# Patient Record
Sex: Female | Born: 1995 | Race: Black or African American | Hispanic: No | Marital: Single | Smoking: Current some day smoker
Health system: Southern US, Community
[De-identification: ages and names within clinical notes are randomized; demographics above are authoritative.]

## PROBLEM LIST (undated history)

## (undated) DIAGNOSIS — D649 Anemia, unspecified: Secondary | ICD-10-CM

---

## 2021-01-14 ENCOUNTER — Other Ambulatory Visit: Payer: Self-pay

## 2021-01-14 ENCOUNTER — Emergency Department (HOSPITAL_COMMUNITY): Payer: BC Managed Care – PPO

## 2021-01-14 ENCOUNTER — Observation Stay (HOSPITAL_COMMUNITY)
Admission: EM | Admit: 2021-01-14 | Discharge: 2021-01-15 | Disposition: A | Discharge status: Home / Self Care | Payer: BC Managed Care – PPO | Attending: Internal Medicine | Admitting: Internal Medicine

## 2021-01-14 ENCOUNTER — Encounter (HOSPITAL_COMMUNITY): Payer: Self-pay

## 2021-01-14 DIAGNOSIS — R509 Fever, unspecified: Secondary | ICD-10-CM

## 2021-01-14 DIAGNOSIS — Z20822 Contact with and (suspected) exposure to covid-19: Secondary | ICD-10-CM | POA: Diagnosis not present

## 2021-01-14 DIAGNOSIS — Z79899 Other long term (current) drug therapy: Secondary | ICD-10-CM | POA: Diagnosis not present

## 2021-01-14 DIAGNOSIS — R1030 Lower abdominal pain, unspecified: Secondary | ICD-10-CM | POA: Insufficient documentation

## 2021-01-14 DIAGNOSIS — N390 Urinary tract infection, site not specified: Principal | ICD-10-CM | POA: Insufficient documentation

## 2021-01-14 DIAGNOSIS — R000 Tachycardia, unspecified: Secondary | ICD-10-CM | POA: Diagnosis not present

## 2021-01-14 DIAGNOSIS — F17290 Nicotine dependence, other tobacco product, uncomplicated: Secondary | ICD-10-CM | POA: Diagnosis not present

## 2021-01-14 DIAGNOSIS — D649 Anemia, unspecified: Secondary | ICD-10-CM | POA: Insufficient documentation

## 2021-01-14 DIAGNOSIS — R300 Dysuria: Secondary | ICD-10-CM

## 2021-01-14 HISTORY — DX: Anemia, unspecified: D64.9

## 2021-01-14 LAB — COMPREHENSIVE METABOLIC PANEL
ALT: 15 U/L (ref 0–44)
AST: 18 U/L (ref 15–41)
Albumin: 3.3 g/dL — ABNORMAL LOW (ref 3.5–5.0)
Alkaline Phosphatase: 42 U/L (ref 38–126)
Anion gap: 6 (ref 5–15)
BUN: <5 mg/dL — ABNORMAL LOW (ref 6–20)
CO2: 24 mmol/L (ref 22–32)
Calcium: 8.8 mg/dL — ABNORMAL LOW (ref 8.9–10.3)
Chloride: 108 mmol/L (ref 98–111)
Creatinine, Ser: 0.67 mg/dL (ref 0.44–1.00)
GFR, Estimated: >60 mL/min (ref >60)
Glucose, Bld: 120 mg/dL — ABNORMAL HIGH (ref 70–99)
Potassium: 3.3 mmol/L — ABNORMAL LOW (ref 3.5–5.1)
Sodium: 138 mmol/L (ref 135–145)
Total Bilirubin: 0.5 mg/dL (ref 0.3–1.2)
Total Protein: 7.4 g/dL (ref 6.5–8.1)

## 2021-01-14 LAB — CBC WITH DIFFERENTIAL/PLATELET
Abs Immature Granulocytes: 0.1 10*3/uL — ABNORMAL HIGH (ref 0.00–0.07)
Basophils Absolute: 0 10*3/uL (ref 0.0–0.1)
Basophils Relative: 0 %
Eosinophils Absolute: 0.5 10*3/uL (ref 0.0–0.5)
Eosinophils Relative: 3 %
HCT: 28.8 % — ABNORMAL LOW (ref 36.0–46.0)
Hemoglobin: 8.2 g/dL — ABNORMAL LOW (ref 12.0–15.0)
Immature Granulocytes: 1 %
Lymphocytes Relative: 5 %
Lymphs Abs: 0.9 10*3/uL (ref 0.7–4.0)
MCH: 22 pg — ABNORMAL LOW (ref 26.0–34.0)
MCHC: 28.5 g/dL — ABNORMAL LOW (ref 30.0–36.0)
MCV: 77.2 fL — ABNORMAL LOW (ref 80.0–100.0)
Monocytes Absolute: 1.1 10*3/uL — ABNORMAL HIGH (ref 0.1–1.0)
Monocytes Relative: 6 %
Neutro Abs: 15.3 10*3/uL — ABNORMAL HIGH (ref 1.7–7.7)
Neutrophils Relative %: 85 %
Platelets: 503 10*3/uL — ABNORMAL HIGH (ref 150–400)
RBC: 3.73 MIL/uL — ABNORMAL LOW (ref 3.87–5.11)
RDW: 16.6 % — ABNORMAL HIGH (ref 11.5–15.5)
WBC: 18 10*3/uL — ABNORMAL HIGH (ref 4.0–10.5)
nRBC: 0 % (ref 0.0–0.2)

## 2021-01-14 LAB — LACTIC ACID, PLASMA
Lactic Acid, Venous: 2 mmol/L (ref 0.5–1.9)
Lactic Acid, Venous: 1 mmol/L (ref 0.5–1.9)

## 2021-01-14 LAB — URINALYSIS, ROUTINE W REFLEX MICROSCOPIC
Bilirubin Urine: NEGATIVE
Glucose, UA: NEGATIVE mg/dL
Hgb urine dipstick: NEGATIVE
Ketones, ur: NEGATIVE mg/dL
Leukocytes,Ua: NEGATIVE
Nitrite: NEGATIVE
Protein, ur: NEGATIVE mg/dL
Specific Gravity, Urine: 1.009 (ref 1.005–1.030)
pH: 8 (ref 5.0–8.0)

## 2021-01-14 LAB — TROPONIN I (HIGH SENSITIVITY)
Troponin I (High Sensitivity): 3 ng/L (ref <18)
Troponin I (High Sensitivity): 12 ng/L (ref <18)

## 2021-01-14 LAB — PROTIME-INR
INR: 1 (ref 0.8–1.2)
Prothrombin Time: 13.6 seconds (ref 11.4–15.2)

## 2021-01-14 LAB — LIPASE, BLOOD: Lipase: 22 U/L (ref 11–51)

## 2021-01-14 LAB — HIV ANTIBODY (ROUTINE TESTING W REFLEX): HIV Screen 4th Generation wRfx: NONREACTIVE

## 2021-01-14 LAB — I-STAT BETA HCG BLOOD, ED (MC, WL, AP ONLY): I-stat hCG, quantitative: <5 m[IU]/mL (ref <5)

## 2021-01-14 LAB — RESP PANEL BY RT-PCR (FLU A&B, COVID) ARPGX2
Influenza A by PCR: NEGATIVE
Influenza B by PCR: NEGATIVE
SARS Coronavirus 2 by RT PCR: NEGATIVE

## 2021-01-14 LAB — APTT: aPTT: 32 seconds (ref 24–36)

## 2021-01-14 MED ORDER — LACTATED RINGERS IV BOLUS
1000.0000 mL | Freq: Once | INTRAVENOUS | Status: AC
  Administered 2021-01-14: 1000 mL via INTRAVENOUS

## 2021-01-14 MED ORDER — PIPERACILLIN-TAZOBACTAM 3.375 G IVPB
3.3750 g | Freq: Three times a day (TID) | INTRAVENOUS | Status: DC
  Administered 2021-01-14 – 2021-01-15 (×2): 3.375 g via INTRAVENOUS
  Filled 2021-01-14 (×3): qty 50

## 2021-01-14 MED ORDER — ACETAMINOPHEN 325 MG PO TABS: 650.0000 mg | ORAL_TABLET | Freq: Four times a day (QID) | ORAL | Status: DC | PRN

## 2021-01-14 MED ORDER — ENOXAPARIN SODIUM 40 MG/0.4ML IJ SOSY
40.0000 mg | PREFILLED_SYRINGE | INTRAMUSCULAR | Status: DC
  Administered 2021-01-14: 40 mg via SUBCUTANEOUS
  Filled 2021-01-14: qty 0.4

## 2021-01-14 MED ORDER — LACTATED RINGERS IV BOLUS
850.0000 mL | Freq: Once | INTRAVENOUS | Status: AC
  Administered 2021-01-14: 850 mL via INTRAVENOUS

## 2021-01-14 MED ORDER — ACETAMINOPHEN 325 MG PO TABS
650.0000 mg | ORAL_TABLET | Freq: Once | ORAL | Status: AC
  Administered 2021-01-14: 650 mg via ORAL
  Filled 2021-01-14: qty 2

## 2021-01-14 MED ORDER — ACETAMINOPHEN 650 MG RE SUPP: 650.0000 mg | Freq: Four times a day (QID) | RECTAL | Status: DC | PRN

## 2021-01-14 MED ORDER — IOHEXOL 350 MG/ML SOLN
100.0000 mL | Freq: Once | INTRAVENOUS | Status: AC | PRN
  Administered 2021-01-14: 100 mL via INTRAVENOUS

## 2021-01-14 MED ORDER — PIPERACILLIN-TAZOBACTAM 3.375 G IVPB 30 MIN
3.3750 g | Freq: Once | INTRAVENOUS | Status: AC
  Administered 2021-01-14: 3.375 g via INTRAVENOUS
  Filled 2021-01-14: qty 50

## 2021-01-14 NOTE — ED Provider Notes (Signed)
[facility] EMERGENCY DEPARTMENT Provider Note   CSN: [id number] Arrival date & time: 01/14/21  4098     History Chief Complaint  Patient presents with   Tachycardia    Melody Holloway is a 25 y.o. female.  HPI Patient is a 25 year old female who presents to the emergency department due to dysuria.  Patient states that her symptoms started about 1 week ago.  She was initially experiencing some mild lower abdominal pain and went to urgent care for evaluation.  Her UA was pending at the time but they started her on Macrobid due to her symptoms.  She states that she took about 3 doses and was called by urgent care and told that her UA was negative so she discontinued this medication.  She states that after this she then went to the emergency department for evaluation and was told that she had a reassuring work-up and was discharged in stable condition.  About 4 to 5 days ago her symptoms began once again with associated dysuria.  She states that she has been taking Azo.  This morning she woke up with her dysuria as well as lower abdominal pain but also notes fatigue, body aches, rhinorrhea, cough, mild sore throat.  Reports a history of previous COVID-19 infection in early 2021 but has not been vaccinated for COVID-19.  Patient also notes some chest tightness this morning when her symptoms first started but this has resolved.  She has been having persistent shortness of breath since waking up this morning which is why she called EMS.  Upon EMS arrival patient's heart rate was about 160 bpm.  They performed vagal maneuvers which dropped her heart rate to 90 briefly before her tachycardia resumed once again.    Past Medical History:  Diagnosis Date   Anemia     There are no problems to display for this patient.   History reviewed. No pertinent surgical history.   OB History   No obstetric history on file.     History reviewed. No pertinent family history.  Social History    Tobacco Use   Smoking status: Some Days    Types: Pipe   Smokeless tobacco: Never  Vaping Use   Vaping Use: Never used  Substance Use Topics   Alcohol use: Not Currently   Drug use: Never    Home Medications Prior to Admission medications   Medication Sig Start Date End Date Taking? Authorizing Provider  ibuprofen (ADVIL) 200 MG tablet Take 200 mg by mouth every 6 (six) hours as needed for mild pain or headache.   Yes [provider]  loratadine (CLARITIN) 10 MG tablet Take 10 mg by mouth daily as needed for allergies.   Yes [provider]  nitrofurantoin, macrocrystal-monohydrate, (MACROBID) 100 MG capsule Take 100 mg by mouth 2 (two) times daily. 10 DS 01/05/21  Yes [provider]  phenazopyridine (PYRIDIUM) 95 MG tablet Take 95 mg by mouth 3 (three) times daily as needed for pain.   Yes [provider]    Allergies    Patient has no known allergies.  Review of Systems   Review of Systems  All other systems reviewed and are negative. Ten systems reviewed and are negative for acute change, except as noted in the HPI.   Physical Exam Updated Vital Signs BP 132/73   Pulse (!) 124   Temp 98.6 F (37 C) (Oral)   Resp (!) 25   Ht  (1.702 m)   Wt  95.7 kg   SpO2 100%   BMI 33.05 kg/m   Physical Exam Vitals and nursing note reviewed.  Constitutional:      General: She is not in acute distress.    Appearance: Normal appearance. She is not ill-appearing, toxic-appearing or diaphoretic.  HENT:     Head: Normocephalic and atraumatic.     Right Ear: External ear normal.     Left Ear: External ear normal.     Nose: Nose normal.     Mouth/Throat:     Mouth: Mucous membranes are moist.     Pharynx: Oropharynx is clear. No oropharyngeal exudate or posterior oropharyngeal erythema.  Eyes:     Extraocular Movements: Extraocular movements intact.  Cardiovascular:     Rate and Rhythm: Normal rate and regular rhythm.     Pulses: Normal  pulses.     Heart sounds: Normal heart sounds. No murmur heard.   No friction rub. No gallop.  Pulmonary:     Effort: Pulmonary effort is normal. No respiratory distress.     Breath sounds: Normal breath sounds. No stridor. No wheezing, rhonchi or rales.  Abdominal:     General: Abdomen is flat.     Tenderness: There is no abdominal tenderness.  Musculoskeletal:        General: Normal range of motion.     Cervical back: Normal range of motion and neck supple. No tenderness.  Skin:    General: Skin is warm and dry.  Neurological:     General: No focal deficit present.     Mental Status: She is alert and oriented to person, place, and time.  Psychiatric:        Mood and Affect: Mood normal.        Behavior: Behavior normal.    ED Results / Procedures / Treatments   Labs (all labs ordered are listed, but only abnormal results are displayed) Labs Reviewed  LACTIC ACID, PLASMA - Abnormal; Notable for the following components:      Result Value   Lactic Acid, Venous 2.0 (*)    All other components within normal limits  COMPREHENSIVE METABOLIC PANEL - Abnormal; Notable for the following components:   Potassium 3.3 (*)    Glucose, Bld 120 (*)    BUN <5 (*)    Calcium 8.8 (*)    Albumin 3.3 (*)    All other components within normal limits  CBC WITH DIFFERENTIAL/PLATELET - Abnormal; Notable for the following components:   WBC 18.0 (*)    RBC 3.73 (*)    Hemoglobin 8.2 (*)    HCT 28.8 (*)    MCV 77.2 (*)    MCH 22.0 (*)    MCHC 28.5 (*)    RDW 16.6 (*)    Platelets 503 (*)    Neutro Abs 15.3 (*)    Monocytes Absolute 1.1 (*)    Abs Immature Granulocytes 0.10 (*)    All other components within normal limits  RESP PANEL BY RT-PCR (FLU A&B, COVID) ARPGX2  URINE CULTURE  CULTURE, BLOOD (ROUTINE X 2)  CULTURE, BLOOD (ROUTINE X 2)  PROTIME-INR  APTT  URINALYSIS, ROUTINE W REFLEX MICROSCOPIC  LIPASE, BLOOD  LACTIC ACID, PLASMA  CBC WITH DIFFERENTIAL/PLATELET  I-STAT BETA  HCG BLOOD, ED (MC, WL, AP ONLY)  TROPONIN I (HIGH SENSITIVITY)  TROPONIN I (HIGH SENSITIVITY)    EKG EKG Interpretation  Date/Time:  Friday January 14 2021 08:30:38 EDT Ventricular Rate:  133 PR Interval:  141 QRS Duration: 88  QT Interval:  301 QTC Calculation: 448 R Axis:   81 Text Interpretation: Sinus tachycardia Borderline T wave abnormalities Confirmed by Norman Clay ([id number]) on 01/14/2021 11:15:16 AM  Radiology CT Angio Chest PE W and/or Wo Contrast  Result Date: 01/14/2021 CLINICAL DATA:  Abdominal pain and fever EXAM: CT ANGIOGRAPHY CHEST CT ABDOMEN AND PELVIS WITH CONTRAST TECHNIQUE: Multidetector CT imaging of the chest was performed using the standard protocol during bolus administration of intravenous contrast. Multiplanar CT image reconstructions and MIPs were obtained to evaluate the vascular anatomy. Multidetector CT imaging of the abdomen and pelvis was performed using the standard protocol during bolus administration of intravenous contrast. CONTRAST:  OMNIPAQUE IOHEXOL 350 MG/ML SOLN COMPARISON:  None. FINDINGS: CTA CHEST FINDINGS Cardiovascular: Normal appearance of the heart. Normal thoracic aorta. Adequate contrast opacification of the pulmonary arteries. No evidence of pulmonary embolus to the level of the lobar arteries. Limited evaluation of the segmental and subsegmental pulmonary arteries due to streak artifact. Mediastinum/Nodes: No enlarged mediastinal, hilar, or axillary lymph nodes. Thyroid gland, trachea, and esophagus demonstrate no significant findings. Lungs/Pleura: Central airways are patent. Mild ground-glass opacity of the left upper lobe seen on series 4 image 77, likely infectious or inflammatory. Bibasilar atelectasis. Musculoskeletal: No chest wall abnormality. No acute or significant osseous findings. CT ABDOMEN and PELVIS FINDINGS Hepatobiliary: No focal liver abnormality is seen. No gallstones, gallbladder wall thickening, or biliary dilatation.  Pancreas: Unremarkable. No pancreatic ductal dilatation or surrounding inflammatory changes. Spleen: Normal in size without focal abnormality. Adrenals/Urinary Tract: Adrenal glands are unremarkable. Kidneys are normal, without renal calculi, focal lesion, or hydronephrosis. Mild bladder wall thickening. Stomach/Bowel: Stomach is within normal limits. Appendix is not visualized, although there are no secondary findings of acute appendicitis. No evidence of bowel wall thickening, distention, or inflammatory changes. Vascular/Lymphatic: No significant vascular findings are present. No enlarged abdominal or pelvic lymph nodes. Reproductive: Normal appearing uterus. Right adnexal cyst measuring up to 4.3 cm, likely physiologic. Other: Trace free fluid in the pelvis, likely physiologic. No free intraperitoneal air. Musculoskeletal: No acute or significant osseous findings. IMPRESSION: No evidence of pulmonary embolus to the level of the lobar arteries. Limited evaluation of the segmental and subsegmental pulmonary arteries due to streak artifact. Mild ground-glass opacity of the left upper lobe, likely infectious or inflammatory. Bladder wall thickening, findings can be seen in the setting of cystitis. Correlate with urinalysis. Electronically Signed   By: Allegra Lai MD   On: 01/14/2021 12:49   CT ABDOMEN PELVIS W CONTRAST  Result Date: 01/14/2021 CLINICAL DATA:  Abdominal pain and fever EXAM: CT ANGIOGRAPHY CHEST CT ABDOMEN AND PELVIS WITH CONTRAST TECHNIQUE: Multidetector CT imaging of the chest was performed using the standard protocol during bolus administration of intravenous contrast. Multiplanar CT image reconstructions and MIPs were obtained to evaluate the vascular anatomy. Multidetector CT imaging of the abdomen and pelvis was performed using the standard protocol during bolus administration of intravenous contrast. CONTRAST:  OMNIPAQUE IOHEXOL 350 MG/ML SOLN COMPARISON:  None. FINDINGS: CTA  CHEST FINDINGS Cardiovascular: Normal appearance of the heart. Normal thoracic aorta. Adequate contrast opacification of the pulmonary arteries. No evidence of pulmonary embolus to the level of the lobar arteries. Limited evaluation of the segmental and subsegmental pulmonary arteries due to streak artifact. Mediastinum/Nodes: No enlarged mediastinal, hilar, or axillary lymph nodes. Thyroid gland, trachea, and esophagus demonstrate no significant findings. Lungs/Pleura: Central airways are patent. Mild ground-glass opacity of the left upper lobe seen on series 4 image 77, likely infectious  or inflammatory. Bibasilar atelectasis. Musculoskeletal: No chest wall abnormality. No acute or significant osseous findings. CT ABDOMEN and PELVIS FINDINGS Hepatobiliary: No focal liver abnormality is seen. No gallstones, gallbladder wall thickening, or biliary dilatation. Pancreas: Unremarkable. No pancreatic ductal dilatation or surrounding inflammatory changes. Spleen: Normal in size without focal abnormality. Adrenals/Urinary Tract: Adrenal glands are unremarkable. Kidneys are normal, without renal calculi, focal lesion, or hydronephrosis. Mild bladder wall thickening. Stomach/Bowel: Stomach is within normal limits. Appendix is not visualized, although there are no secondary findings of acute appendicitis. No evidence of bowel wall thickening, distention, or inflammatory changes. Vascular/Lymphatic: No significant vascular findings are present. No enlarged abdominal or pelvic lymph nodes. Reproductive: Normal appearing uterus. Right adnexal cyst measuring up to 4.3 cm, likely physiologic. Other: Trace free fluid in the pelvis, likely physiologic. No free intraperitoneal air. Musculoskeletal: No acute or significant osseous findings. IMPRESSION: No evidence of pulmonary embolus to the level of the lobar arteries. Limited evaluation of the segmental and subsegmental pulmonary arteries due to streak artifact. Mild ground-glass  opacity of the left upper lobe, likely infectious or inflammatory. Bladder wall thickening, findings can be seen in the setting of cystitis. Correlate with urinalysis. Electronically Signed   By: Allegra LaiLeah  Strickland MD   On: 01/14/2021 12:49   DG Chest Port 1 View  Result Date: 01/14/2021 CLINICAL DATA:  SOB,HIGH HR,worsening symp of UTI for past week EXAM: PORTABLE CHEST - 1 VIEW COMPARISON:  none FINDINGS: Lungs are clear. Heart size and mediastinal contours are within normal limits. No effusion. Visualized bones unremarkable. IMPRESSION: No acute cardiopulmonary disease. Electronically Signed   By: Corlis Leak  Hassell M.D.   On: 01/14/2021 09:48    Procedures .Critical Care  Date/Time: 01/14/2021 1:25 PM Performed by: Placido SouJoldersma, Trenia Tennyson, PA-C Authorized by: Placido SouJoldersma, Maile Linford, PA-C   Critical care provider statement:    Critical care time (minutes):  30   Critical care was necessary to treat or prevent imminent or life-threatening deterioration of the following conditions:  Sepsis   Critical care was time spent personally by me on the following activities:  Discussions with consultants, evaluation of patient's response to treatment, examination of patient, ordering and performing treatments and interventions, ordering and review of laboratory studies, ordering and review of radiographic studies, pulse oximetry, re-evaluation of patient's condition, obtaining history from patient or surrogate and review of old charts   Medications Ordered in ED Medications  piperacillin-tazobactam (ZOSYN) IVPB 3.375 g (0 g Intravenous Stopped 01/14/21 1324)    Followed by  piperacillin-tazobactam (ZOSYN) IVPB 3.375 g (has no administration in time range)  acetaminophen (TYLENOL) tablet 650 mg (650 mg Oral Given 01/14/21 1024)  lactated ringers bolus 1,000 mL (1,000 mLs Intravenous New Bag/Given 01/14/21 0952)  lactated ringers bolus 850 mL (0 mLs Intravenous Stopped 01/14/21 1203)  iohexol (OMNIPAQUE) 350 MG/ML injection 100  mL (100 mLs Intravenous Contrast Given 01/14/21 1136)    ED Course  I have reviewed the triage vital signs and the nursing notes.  Pertinent labs & imaging results that were available during my care of the patient were reviewed by me and considered in my medical decision making (see chart for details).    MDM Rules/Calculators/A&P                          Pt is a 25 y.o. female who presents to the emergency department with fevers, body aches, shortness of breath, dysuria.  Labs: CBC with a white count of 18, neutrophils of  15.3, monocytes of 1.1, absolute immature granulocytes of 0.1. CMP with a potassium of 3.3, glucose of 120, BUN less than 5, calcium of 8.8, albumin of 3.3. Respiratory panel is negative. PT/INR within normal limits. APTT within normal limits. Lipase of 22. I-STAT beta-hCG less than 5. UA without abnormalities. Blood cultures obtained.  Imaging: Chest x-ray shows no acute cardiopulmonary disease. CTA of the chest and CT scan of the abdomen/pelvis with contrast shows no evidence of PE to the level of the lobar arteries.  Limited evaluation of the segmental and subsegmental pulmonary arteries due to streak artifact.  Mild groundglass opacity in the left upper lobe likely infectious or inflammatory.  Bladder wall thickening which could be seen in the setting of cystitis.  I, Placido Sou, PA-C, personally reviewed and evaluated these images and lab results as part of my medical decision-making.  Unsure the source of the patient's symptoms.  Likely secondary to developing pneumonia versus acute cystitis.  CT scan does show a developing groundglass opacity in the left upper lobe.  Respiratory panel is negative.  CT also shows bladder wall thickening and patient notes current dysuria though her UA is negative.  Patient given a dose of Zosyn and a fluid bolus based on ideal body weight.  Given her persistent tachycardia, initial fever, as well as mildly elevated lactic  acid, feel that admission is warranted.  Will discuss with the medicine team.  Note: Portions of this report may have been transcribed using voice recognition software. Every effort was made to ensure accuracy; however, inadvertent computerized transcription errors may be present.   Final Clinical Impression(s) / ED Diagnoses Final diagnoses:  Fever of unknown origin  Tachycardia  Dysuria   Rx / DC Orders ED Discharge Orders     None        Placido Sou, PA-C 01/14/21 1325    Cheryll Cockayne, MD 01/26/21 2000

## 2021-01-14 NOTE — H&P (Addendum)
Date: 01/14/2021               Patient Name:  Melody Holloway MRN: [id number]  DOB: [birth year] Age / Sex: 25 y.o., female   PCP: No primary care provider on file.              Medical Service: Internal Medicine Teaching Service              Attending Physician: Dr. Inez Catalina, MD    First Contact: Marcelline Mates, Melody 3 Pager: [phone]  Second Contact: Dr. Ellison Carwin Pager: [phone]  Third Contact Dr. Karilyn Cota Pager: [phone]       After Hours (After 5p/  First Contact Pager: [phone]  weekends / holidays): Second Contact Pager: [phone]   Chief Complaint: Dysuria  History of Present Illness:   Melody Holloway is a 25 y/o female with iron deficiency anemia who presents with several days of dysuria.  The pt had her first UTI in 2018 with polyuria and urgency, and was treated successfully with antibiotics. She then had pain with urination in early-mid June 2022 and was treated for a UTI with 5 days of antibiotics. She then felt better for the next two weeks until early July. Melody Holloway then developed dysuria. She went to an Urgent Care early last week and was given empiric Macrobid while a Urine culture was pending. Culture was negative and pt was called and instructed to hold antibiotics. She then began to have cramping pelvic pain and midline back tightness, wondering if its how she sits, but denies overt flank pain. Now describes dysuria as stinging and tugging. She decided to resume her course of Macrobid again in the last 2-3 days.  As she was walking into work this AM she developed SOB and lightheadedness, and had to sit down. Coworkers were concerned and called EMS. Endorses headache and chills last night. Had fever to 102 at home in the last week. No recent travel.   Denies any abnormal vaginal discharge but endorses clear discharge. Currently sexually active with one partner since her last STI evaluation. Previous hx of treated chlamydia and BV several years ago that was treated with  antibiotics.  Meds:  Current Meds  Medication Sig   ibuprofen (ADVIL) 200 MG tablet Take 200 mg by mouth every 6 (six) hours as needed for mild pain or headache.   loratadine (CLARITIN) 10 MG tablet Take 10 mg by mouth daily as needed for allergies.   nitrofurantoin, macrocrystal-monohydrate, (MACROBID) 100 MG capsule Take 100 mg by mouth 2 (two) times daily. 10 DS   phenazopyridine (PYRIDIUM) 95 MG tablet Take 95 mg by mouth 3 (three) times daily as needed for pain.     Allergies: Allergies as of 01/14/2021   (No Known Allergies)   Past Medical History:  Diagnosis Date   Anemia     Family History:  Mother with breast cancer diagnosed at age 77. Denies other breast cancer in family.  Grandfather with MI at age 40. Three half siblings without health problems. No children.  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 Graduated from [employer] Previously smoked cigarettes and Black and Milds occasionally in college. Currently smokes hooka.  Consumes 3-4 drinks of alcohol with friends about once a month. Denies any other illicit drugs or e-cigarettes. Lives alone  Review of Systems: A complete ROS was negative except as per HPI.   Physical Exam: Blood pressure 131/65, pulse (!) 103, temperature  98.7 F (37.1 C), temperature source Oral, resp. rate 20, height 5\' 7"  (1.702 m), weight 95.7 kg, SpO2 100 %.  General: Pleasant, well-appearing woman laying comfortable in bed. No acute distress. Head: Normocephalic. Atraumatic. CV: Tachycardic. No murmurs, rubs, or gallops. No LE edema Pulmonary: Lungs CTAB. Normal effort. No wheezing or rales. Abdominal: Soft, nondistended, tender over suprapubic. Normal bowel sounds. Extremities: Palpable radial and DP pulses. Normal ROM. Skin: Warm and dry. No obvious rash or lesions. Neuro: A&Ox3. Moves all extremities. Normal sensation. No focal deficit. Psych: Normal mood and affect   EKG: personally  reviewed my interpretation is sinus tachycardia  CXR: personally reviewed my interpretation is lungs are clear, heart size normal. No abnormal findings  Assessment & Plan by Problem: Active Problems:   Tachycardia  Recurrent UTI Pt with benign UA today but in the setting of recent Macrobid use and inconsistent antibiotic use. Culture is pending. Symptoms of urinary stinging and tugging consistent with UTI. Lactic acid at 2.0 this AM, with repeat this afternoon at 1.0. Tachycardic on presentation to 140, now at 115, and was minimally febrile at 100.4. Blood cultures pending. WBCs at 18.0k and PLT up to 503k. CT A/P showing bladder wall thickening.  Beta-hCG negative. -Following urine and blood culture -Empiric abx -Consider cervicovaginal swab for STIs -Follow-up HIV antibody  Sinus tachycardia  Patient presented with a heart rate in the 130s to 140s.  Improved slightly after IV fluid bolus.  Sustaining in the low 100s.  EKG shows sinus tachycardia.  May be in the setting of infection.  CTA negative for pulmonary emboli.  Troponin x2 flat.  CTA did show mild groundglass opacity of the left upper lobe read as likely infectious or inflammatory.  This may be due to previous scarring/atelectasis from COVID infection however no previous imaging to compare.  Low suspicion for pneumonia. -Continue cardiac monitoring -IV fluid bolus  Microcytic anemia Known history of anemia, pt takes iron supplement. Hgb at 8.2 with MCV of 77.  -Checking ferritin and iron studies -Trend CBC  Diet: Regular VTE prophylaxis: Lovenox Full code  Dispo: Admit patient to Observation with expected length of stay less than 2 midnights.  Signed MS4 01/14/2021 5:00 PM    Attestation for Student Documentation:  I personally was present and performed or re-performed the history, physical exam and medical decision-making activities of this service and have verified that the service and findings are  accurately documented in the student's note.  Ridge Lafond N, DO 01/14/2021, 7:03 PM

## 2021-01-14 NOTE — ED Notes (Signed)
Pt transported to CT ?

## 2021-01-14 NOTE — Plan of Care (Signed)
  Problem: Education: Goal: Knowledge of General Education information will improve Description Including pain rating scale, medication(s)/side effects and non-pharmacologic comfort measures Outcome: Progressing   

## 2021-01-14 NOTE — ED Triage Notes (Signed)
Pt BIB [facility] after call for SOB. Pt reports feeling unwell yesterday and on EMS arrival HR was 160. EMS had pt perform vagal maneuver which dropped HR to 90 and immediately went back up. Pt reports worsening symp of UTI for past week with abx and feeling of throat tightness since 0600 today. Current HR 135, temp 100.71F

## 2021-01-14 NOTE — Progress Notes (Addendum)
Pharmacy Antibiotic Note  Melody Holloway is a 25 y.o. female admitted on 01/14/2021 with sepsis.  Pharmacy has been consulted for Zosyn dosing.  Plan: Zosyn 3.375g IV q8h (4 hour infusion). -Monitor renal function, clinical status, and antibiotic plan  Height: 5\' 7"  (170.2 cm) Weight: 95.7 kg (211 lb) IBW/kg (Calculated) : 61.6  Temp (24hrs), Avg:99.5 F (37.5 C), Min:98.6 F (37 C), Max:100.4 F (38 C)  Recent Labs  Lab 01/14/21 0852 01/14/21 0935 01/14/21 0939  WBC 18.0*  --   --   CREATININE  --  0.67  --   LATICACIDVEN  --   --  2.0*    Estimated Creatinine Clearance: 125.4 mL/min (by C-G formula based on SCr of 0.67 mg/dL).    No Known Allergies  Antimicrobials this admission: Zosyn 7/29 >>   Microbiology results: 7/29 BCx:  7/29 UCx:   Thank you for allowing pharmacy to be a part of this patient's care.  8/29, PharmD, [facility] Emergency Medicine Clinical Pharmacist ED RPh Phone: [phone] Main RX: [phone]

## 2021-01-15 DIAGNOSIS — R000 Tachycardia, unspecified: Secondary | ICD-10-CM

## 2021-01-15 DIAGNOSIS — R300 Dysuria: Secondary | ICD-10-CM

## 2021-01-15 LAB — BASIC METABOLIC PANEL
Anion gap: 6 (ref 5–15)
BUN: 5 mg/dL — ABNORMAL LOW (ref 6–20)
CO2: 24 mmol/L (ref 22–32)
Calcium: 8.4 mg/dL — ABNORMAL LOW (ref 8.9–10.3)
Chloride: 107 mmol/L (ref 98–111)
Creatinine, Ser: 0.78 mg/dL (ref 0.44–1.00)
GFR, Estimated: >60 mL/min (ref >60)
Glucose, Bld: 92 mg/dL (ref 70–99)
Potassium: 3.3 mmol/L — ABNORMAL LOW (ref 3.5–5.1)
Sodium: 137 mmol/L (ref 135–145)

## 2021-01-15 LAB — CBC
HCT: 28.8 % — ABNORMAL LOW (ref 36.0–46.0)
Hemoglobin: 8.3 g/dL — ABNORMAL LOW (ref 12.0–15.0)
MCH: 21.8 pg — ABNORMAL LOW (ref 26.0–34.0)
MCHC: 28.8 g/dL — ABNORMAL LOW (ref 30.0–36.0)
MCV: 75.8 fL — ABNORMAL LOW (ref 80.0–100.0)
Platelets: 481 10*3/uL — ABNORMAL HIGH (ref 150–400)
RBC: 3.8 MIL/uL — ABNORMAL LOW (ref 3.87–5.11)
RDW: 16.7 % — ABNORMAL HIGH (ref 11.5–15.5)
WBC: 12.7 10*3/uL — ABNORMAL HIGH (ref 4.0–10.5)
nRBC: 0 % (ref 0.0–0.2)

## 2021-01-15 LAB — URINE CULTURE: Culture: NO GROWTH

## 2021-01-15 MED ORDER — CIPROFLOXACIN HCL 500 MG PO TABS: 500.0000 mg | ORAL_TABLET | Freq: Two times a day (BID) | ORAL | Status: DC

## 2021-01-15 MED ORDER — LACTATED RINGERS IV BOLUS
1000.0000 mL | Freq: Once | INTRAVENOUS | Status: AC
  Administered 2021-01-15: 1000 mL via INTRAVENOUS

## 2021-01-15 MED ORDER — SULFAMETHOXAZOLE-TRIMETHOPRIM 800-160 MG PO TABS: 1.0000 | ORAL_TABLET | Freq: Two times a day (BID) | ORAL | 0 refills | Status: AC

## 2021-01-15 MED ORDER — SULFAMETHOXAZOLE-TRIMETHOPRIM 800-160 MG PO TABS
1.0000 | ORAL_TABLET | Freq: Two times a day (BID) | ORAL | Status: DC
  Administered 2021-01-15: 1 via ORAL
  Filled 2021-01-15: qty 1

## 2021-01-15 MED ORDER — SULFAMETHOXAZOLE-TRIMETHOPRIM 800-160 MG PO TABS
1.0000 | ORAL_TABLET | Freq: Two times a day (BID) | ORAL | Status: DC
  Filled 2021-01-15: qty 1

## 2021-01-15 MED ORDER — POTASSIUM CHLORIDE CRYS ER 20 MEQ PO TBCR
40.0000 meq | EXTENDED_RELEASE_TABLET | Freq: Once | ORAL | Status: AC
  Administered 2021-01-15: 40 meq via ORAL
  Filled 2021-01-15: qty 2

## 2021-01-15 NOTE — Discharge Instructions (Addendum)
You were hospitalized for persistent UTI. Thank you for allowing [place] to be part of your care.   We will call you to arrange for you to follow up at: [facility]   Please note these changes made to your medications:   Please START taking:  Bactrim 1 tablet twice a day for 7 days   Please STOP taking:  Macrobid   Please make sure to follow up in clinic with [place] so we can make sure you are no longer having pain/infection  Please call our clinic if you have any questions or concerns, we may be able to help and keep you from a long and expensive emergency room wait. Our clinic and after hours phone number is [phone], the best time to call is Monday through Friday 9 am to 4 pm but there is always someone available 24/7 if you have an emergency. If you need medication refills please notify your pharmacy one week in advance and they will send [place] a request.

## 2021-01-15 NOTE — Hospital Course (Signed)
Recurrent UTI Pt presented with incompletely treated UTI over the last two weeks with symptoms of dysuria and pelvic cramping. Pt took empiric Macrobid for two days nearly two weeks ago after Urgent Care visit, followed by negative culture. Symptoms returned last week and went to separate ED with suspicion for virus vs PID, on 01/07/21. Pt's dysuria worsened, she restarted her Macrobid for 2-3 days before presenting to this ED on 01/14/21. This ED work up w/ CT A/P noting bladder inflammation. Pt also presented with leukocytosis at 18k, predominantly neutrophilia at 15k, and low fever at 100.4. Blood cultures and urine cultures pending. Began IV Zosyn and became afebrile overnight with WBCs down to 12k, and appreciated symptomatic improvement. Pt preferred discharge with PO Antibiotics which was reasonable. Checked GC swab as well.   Sinus tachycardia Patient presented to this ED with HR in 130s to 140s. Improved after multiple IV LR boluses. EKG showed sinus tachycardia. Stable serial troponins. CTA Negative for PE but did note mild groundglass opacity of the left upper lobe in absence of respiratory symptoms. Possibly due to previous scarring atelectasis from prior COVID infection. Ordered interferon as well. Was monitored with telemetry without concern.  Microcytic anemia Known history of anemia. Pt taking PO Iron supplement. Hgb at 8.2 with MCV of 77. -Recommend continuing PO Iron replacement and evaluating ferritin over time with PCP.

## 2021-01-15 NOTE — Discharge Summary (Signed)
Name: Melody Holloway MRN: [id number] DOB: Jan 20, 1996 25 y.o. PCP: No primary care provider on file.  Date of Admission: 01/14/2021  8:22 AM Date of Discharge: 7/30/20227/30/22 Attending Physician: No att. providers found  Discharge Diagnosis: Recurrent UTI Sinus tachycardia Microcytic anemia  Discharge Medications: Allergies as of 01/15/2021   No Known Allergies      Medication List     STOP taking these medications    nitrofurantoin (macrocrystal-monohydrate) 100 MG capsule Commonly known as: MACROBID       TAKE these medications    ibuprofen 200 MG tablet Commonly known as: ADVIL Take 200 mg by mouth every 6 (six) hours as needed for mild pain or headache.   loratadine 10 MG tablet Commonly known as: CLARITIN Take 10 mg by mouth daily as needed for allergies.   phenazopyridine 95 MG tablet Commonly known as: PYRIDIUM Take 95 mg by mouth 3 (three) times daily as needed for pain.   sulfamethoxazole-trimethoprim 800-160 MG tablet Commonly known as: BACTRIM DS Take 1 tablet by mouth every 12 (twelve) hours for 7 days.        Disposition and follow-up:   Ms.Anneke Koone was discharged from [facility] in Good condition.  At the hospital follow up visit please address:  1.  Ensure continued resolution of UTI sx.   2.  Labs / imaging needed at time of follow-up: none  3.  Pending labs/ test needing follow-up: Blood culture x2. Urine culture. Quantiferon-TB. Cervicovaginal swab.  Follow-up Appointments:  Follow-up Information     [facility] Follow up.   Why: Our office will call you to set up a hospital follow up appointment within 1-2 weeks. Please call them if you have not heard from them. Contact information: [address] [place] [address] [phone]                Hospital Course by problem list: Recurrent UTI Pt presented with incompletely treated UTI over the last two weeks with  symptoms of dysuria and pelvic cramping. Pt took empiric Macrobid for two days nearly two weeks ago after Urgent Care visit, followed by negative culture. Symptoms returned last week and went to separate ED with suspicion for virus vs PID, on 01/07/21. Pt's dysuria worsened, she restarted her Macrobid for 2-3 days before presenting to this ED on 01/14/21. This ED work up w/ CT A/P noting bladder inflammation. Pt also presented with leukocytosis at 18k, predominantly neutrophilia at 15k, LA of 2 and low fever at 100.4. Blood cultures and urine cultures pending. Began IV Zosyn and became afebrile overnight with WBCs down to 12k, and appreciated symptomatic improvement. Pt preferred discharge with PO Antibiotics which was reasonable. Checked GC swab as well.   Sinus tachycardia Patient presented to this ED with HR in 130s to 140s. Improved after multiple IV LR boluses. EKG showed sinus tachycardia. Stable serial troponins. CTA Negative for PE but did note mild groundglass opacity of the left upper lobe in absence of respiratory symptoms. Possibly due to previous scarring atelectasis from prior COVID infection. Ordered interferon as well. Was monitored with telemetry without concern.  Microcytic anemia Known history of anemia. Pt taking PO Iron supplement. Hgb at 8.2 with MCV of 77. -Recommend continuing PO Iron replacement and evaluating ferritin over time with PCP.   Discharge Exam:   BP 116/73 (BP Location: Right Arm)   Pulse 90   Temp 98 F (36.7 C) (Oral)   Resp 17  Ht 5\' 7"  (1.702 m)   Wt 95.7 kg   SpO2 99%   BMI 33.05 kg/m  Discharge exam:  General: Pleasant, well-appearing woman laying comfortably in bed. No acute distress. Head: Normocephalic. Atraumatic. CV: RRR. No murmurs, rubs, or gallops. No LE edema Pulmonary: Lungs CTAB. Normal effort. No wheezing or rales. Abdominal: Soft, nontender, nondistended. Normal bowel sounds. Extremities: Palpable radial and DP pulses. Normal  ROM. Skin: Warm and dry. No obvious rash or lesions. Neuro: A&Ox3. Moves all extremities. Normal sensation. No focal deficit. Psych: Normal mood and affect   Pertinent Labs, Studies, and Procedures:  CT Angio Chest PE W and/or Wo Contrast  Result Date: 01/14/2021 CLINICAL DATA:  Abdominal pain and fever EXAM: CT ANGIOGRAPHY CHEST CT ABDOMEN AND PELVIS WITH CONTRAST TECHNIQUE: Multidetector CT imaging of the chest was performed using the standard protocol during bolus administration of intravenous contrast. Multiplanar CT image reconstructions and MIPs were obtained to evaluate the vascular anatomy. Multidetector CT imaging of the abdomen and pelvis was performed using the standard protocol during bolus administration of intravenous contrast. CONTRAST:  01/16/2021 OMNIPAQUE IOHEXOL 350 MG/ML SOLN COMPARISON:  None. FINDINGS: CTA CHEST FINDINGS Cardiovascular: Normal appearance of the heart. Normal thoracic aorta. Adequate contrast opacification of the pulmonary arteries. No evidence of pulmonary embolus to the level of the lobar arteries. Limited evaluation of the segmental and subsegmental pulmonary arteries due to streak artifact. Mediastinum/Nodes: No enlarged mediastinal, hilar, or axillary lymph nodes. Thyroid gland, trachea, and esophagus demonstrate no significant findings. Lungs/Pleura: Central airways are patent. Mild ground-glass opacity of the left upper lobe seen on series 4 image 77, likely infectious or inflammatory. Bibasilar atelectasis. Musculoskeletal: No chest wall abnormality. No acute or significant osseous findings. CT ABDOMEN and PELVIS FINDINGS Hepatobiliary: No focal liver abnormality is seen. No gallstones, gallbladder wall thickening, or biliary dilatation. Pancreas: Unremarkable. No pancreatic ductal dilatation or surrounding inflammatory changes. Spleen: Normal in size without focal abnormality. Adrenals/Urinary Tract: Adrenal glands are unremarkable. Kidneys are normal, without renal  calculi, focal lesion, or hydronephrosis. Mild bladder wall thickening. Stomach/Bowel: Stomach is within normal limits. Appendix is not visualized, although there are no secondary findings of acute appendicitis. No evidence of bowel wall thickening, distention, or inflammatory changes. Vascular/Lymphatic: No significant vascular findings are present. No enlarged abdominal or pelvic lymph nodes. Reproductive: Normal appearing uterus. Right adnexal cyst measuring up to 4.3 cm, likely physiologic. Other: Trace free fluid in the pelvis, likely physiologic. No free intraperitoneal air. Musculoskeletal: No acute or significant osseous findings. IMPRESSION: No evidence of pulmonary embolus to the level of the lobar arteries. Limited evaluation of the segmental and subsegmental pulmonary arteries due to streak artifact. Mild ground-glass opacity of the left upper lobe, likely infectious or inflammatory. Bladder wall thickening, findings can be seen in the setting of cystitis. Correlate with urinalysis. Electronically Signed   By: MD   On: 01/14/2021 12:49   CT ABDOMEN PELVIS W CONTRAST  Result Date: 01/14/2021 CLINICAL DATA:  Abdominal pain and fever EXAM: CT ANGIOGRAPHY CHEST CT ABDOMEN AND PELVIS WITH CONTRAST TECHNIQUE: Multidetector CT imaging of the chest was performed using the standard protocol during bolus administration of intravenous contrast. Multiplanar CT image reconstructions and MIPs were obtained to evaluate the vascular anatomy. Multidetector CT imaging of the abdomen and pelvis was performed using the standard protocol during bolus administration of intravenous contrast. CONTRAST:  01/16/2021 OMNIPAQUE IOHEXOL 350 MG/ML SOLN COMPARISON:  None. FINDINGS: CTA CHEST FINDINGS Cardiovascular: Normal appearance of the heart. Normal thoracic  aorta. Adequate contrast opacification of the pulmonary arteries. No evidence of pulmonary embolus to the level of the lobar arteries. Limited evaluation of  the segmental and subsegmental pulmonary arteries due to streak artifact. Mediastinum/Nodes: No enlarged mediastinal, hilar, or axillary lymph nodes. Thyroid gland, trachea, and esophagus demonstrate no significant findings. Lungs/Pleura: Central airways are patent. Mild ground-glass opacity of the left upper lobe seen on series 4 image 77, likely infectious or inflammatory. Bibasilar atelectasis. Musculoskeletal: No chest wall abnormality. No acute or significant osseous findings. CT ABDOMEN and PELVIS FINDINGS Hepatobiliary: No focal liver abnormality is seen. No gallstones, gallbladder wall thickening, or biliary dilatation. Pancreas: Unremarkable. No pancreatic ductal dilatation or surrounding inflammatory changes. Spleen: Normal in size without focal abnormality. Adrenals/Urinary Tract: Adrenal glands are unremarkable. Kidneys are normal, without renal calculi, focal lesion, or hydronephrosis. Mild bladder wall thickening. Stomach/Bowel: Stomach is within normal limits. Appendix is not visualized, although there are no secondary findings of acute appendicitis. No evidence of bowel wall thickening, distention, or inflammatory changes. Vascular/Lymphatic: No significant vascular findings are present. No enlarged abdominal or pelvic lymph nodes. Reproductive: Normal appearing uterus. Right adnexal cyst measuring up to 4.3 cm, likely physiologic. Other: Trace free fluid in the pelvis, likely physiologic. No free intraperitoneal air. Musculoskeletal: No acute or significant osseous findings. IMPRESSION: No evidence of pulmonary embolus to the level of the lobar arteries. Limited evaluation of the segmental and subsegmental pulmonary arteries due to streak artifact. Mild ground-glass opacity of the left upper lobe, likely infectious or inflammatory. Bladder wall thickening, findings can be seen in the setting of cystitis. Correlate with urinalysis. Electronically Signed   By: Allegra Lai MD   On: 01/14/2021  12:49   DG Chest Port 1 View  Result Date: 01/14/2021 CLINICAL DATA:  SOB,HIGH HR,worsening symp of UTI for past week EXAM: PORTABLE CHEST - 1 VIEW COMPARISON:  none FINDINGS: Lungs are clear. Heart size and mediastinal contours are within normal limits. No effusion. Visualized bones unremarkable. IMPRESSION: No acute cardiopulmonary disease. Electronically Signed   By: Corlis Leak M.D.   On: 01/14/2021 09:48     Discharge Instructions: Discharge Instructions     Call MD for:  persistant nausea and vomiting   Complete by: As directed    Call MD for:  redness, tenderness, or signs of infection (pain, swelling, redness, odor or green/yellow discharge around incision site)   Complete by: As directed    Call MD for:  severe uncontrolled pain   Complete by: As directed    Call MD for:  temperature >100.4   Complete by: As directed      You were hospitalized for persistent UTI. Thank you for allowing [place] to be part of your care.   We will call you to arrange for you to follow up at: [facility]   Please note these changes made to your medications:   Please START taking:  Bactrim 1 tablet twice a day for 7 days   Please STOP taking:  Macrobid   Please make sure to follow up in clinic with [place] so we can make sure you are no longer having pain/infection  Please call our clinic if you have any questions or concerns, we may be able to help and keep you from a long and expensive emergency room wait. Our clinic and after hours phone number is [phone], the best time to call is Monday through Friday 9 am to 4 pm but there is always someone available 24/7 if  you have an emergency. If you need medication refills please notify your pharmacy one week in advance and they will send us a request.    Signed: Alonda Weaber N, DO 01/15/2021, 6:20 PM

## 2021-01-17 LAB — CERVICOVAGINAL ANCILLARY ONLY
Bacterial Vaginitis (gardnerella): NEGATIVE
Candida Glabrata: NEGATIVE
Candida Vaginitis: POSITIVE — AB
Chlamydia: NEGATIVE
Comment: NEGATIVE
Comment: NEGATIVE
Comment: NEGATIVE
Comment: NEGATIVE
Comment: NEGATIVE
Comment: NORMAL
Neisseria Gonorrhea: NEGATIVE
Trichomonas: POSITIVE — AB

## 2021-01-18 ENCOUNTER — Other Ambulatory Visit: Payer: Self-pay | Admitting: Internal Medicine

## 2021-01-18 ENCOUNTER — Telehealth: Payer: Self-pay | Admitting: Internal Medicine

## 2021-01-18 DIAGNOSIS — B379 Candidiasis, unspecified: Secondary | ICD-10-CM

## 2021-01-18 DIAGNOSIS — A599 Trichomoniasis, unspecified: Secondary | ICD-10-CM

## 2021-01-18 MED ORDER — METRONIDAZOLE 500 MG PO TABS
500.0000 mg | ORAL_TABLET | Freq: Two times a day (BID) | ORAL | 0 refills | Status: AC
Start: 2021-01-18 — End: 2021-01-25

## 2021-01-18 MED ORDER — FLUCONAZOLE 150 MG PO TABS: 150.0000 mg | ORAL_TABLET | Freq: Once | ORAL | 0 refills | Status: AC

## 2021-01-18 NOTE — Telephone Encounter (Signed)
TOC HFU  no appt scheduled.  Per patient has an appointment this coming Monday 01/24/2021 to establish care at another primary care office.  Was unable to find the office name while we were on the phone.  Just informed patient to call us back if anything changes.

## 2021-01-18 NOTE — Telephone Encounter (Signed)
-----   Message from Jaci Standard, DO sent at 01/15/2021  6:22 PM EDT ----- Patient would like to establish care with our clinic. Hospital f/u visit in ~2 weeks. Thanks!

## 2021-01-18 NOTE — Progress Notes (Signed)
Called patient on 8/222 at 12:25 PM to discuss cervicovaginal swab results.  Patient tested positive for Candida species, negative for Candida glabrata.  Patient also positive for trichomonas.  Discussed treatment with flucanazole 150 mg once and metronidazole 500 mg twice daily for 7 days.  Also informed the patient that her partner should also seek treatment to prevent risk of further transmission or recurrent infection.  Patient expressed understanding.  Medication sent to her [facility] pharmacy in [place].

## 2021-01-19 LAB — CULTURE, BLOOD (ROUTINE X 2)
Culture: NO GROWTH
Culture: NO GROWTH
Special Requests: ADEQUATE

## 2021-01-21 LAB — QUANTIFERON-TB GOLD PLUS (RQFGPL)
QuantiFERON Mitogen Value: >10 IU/mL
QuantiFERON Nil Value: 5.55 IU/mL
QuantiFERON TB1 Ag Value: 0.93 IU/mL
QuantiFERON TB2 Ag Value: 0.31 IU/mL

## 2021-01-21 LAB — QUANTIFERON-TB GOLD PLUS: QuantiFERON-TB Gold Plus: NEGATIVE

## 2022-08-08 IMAGING — CT CT ANGIO CHEST
2 of 6 series · 17 of 46 positions shown · IV contrast (APPLIED)
Comparison: None.

CLINICAL DATA: Abdominal pain and fever

EXAM:
CT ANGIOGRAPHY CHEST
CT ABDOMEN AND PELVIS WITH CONTRAST
TECHNIQUE: Multidetector CT imaging of the chest was performed using the
standard protocol during bolus administration of intravenous
contrast. Multiplanar CT image reconstructions and MIPs were
obtained to evaluate the vascular anatomy. Multidetector CT imaging
of the abdomen and pelvis was performed using the standard protocol
during bolus administration of intravenous contrast.
CONTRAST:  100mL OMNIPAQUE IOHEXOL 350 MG/ML SOLN

[Series 4: thins · axial · 0.87mm/px · z∈[+1167,+1390]mm · 15 of 245 slices shown]
[im 11/245  lung]
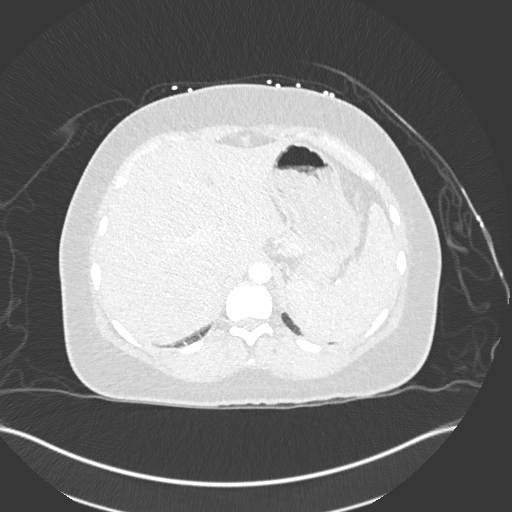
[im 32/245  soft-tissue]
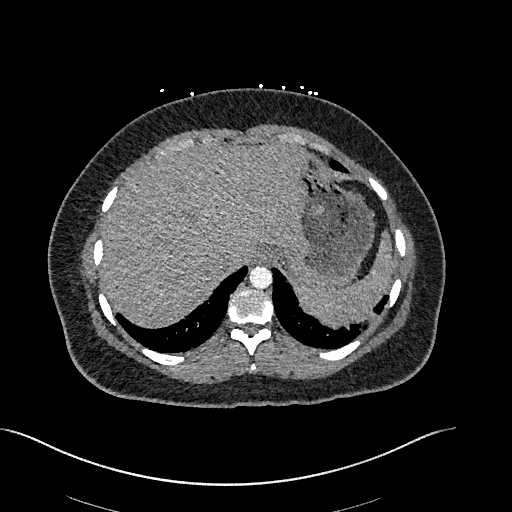
[im 43/245  lung]
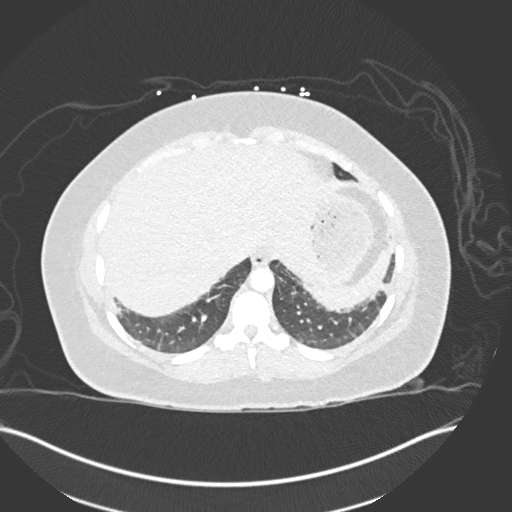
[im 64/245  soft-tissue]
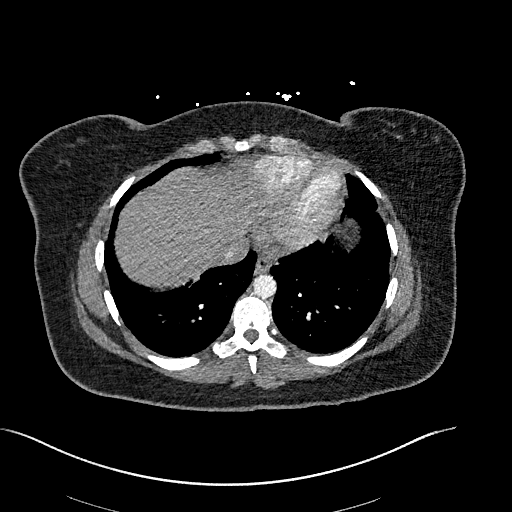
[im 75/245  lung]
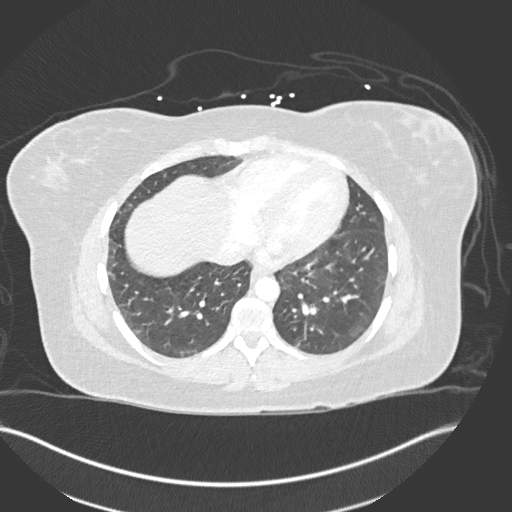
[im 96/245  soft-tissue]
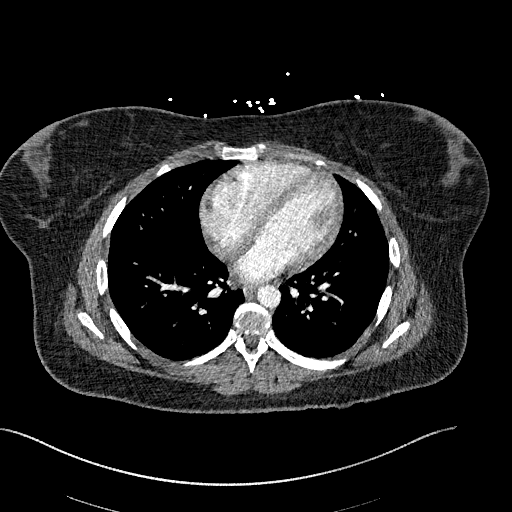
[im 107/245  lung]
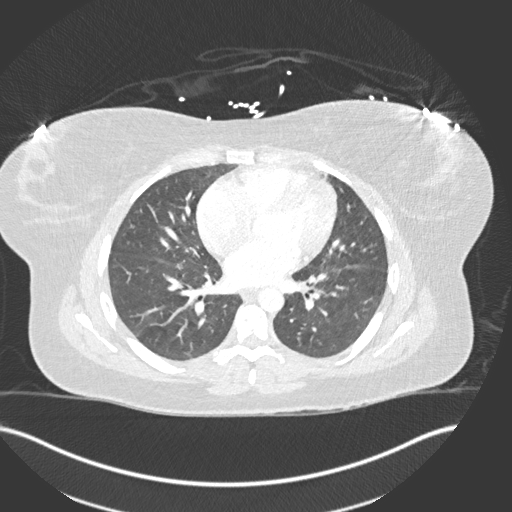
[im 128/245  soft-tissue]
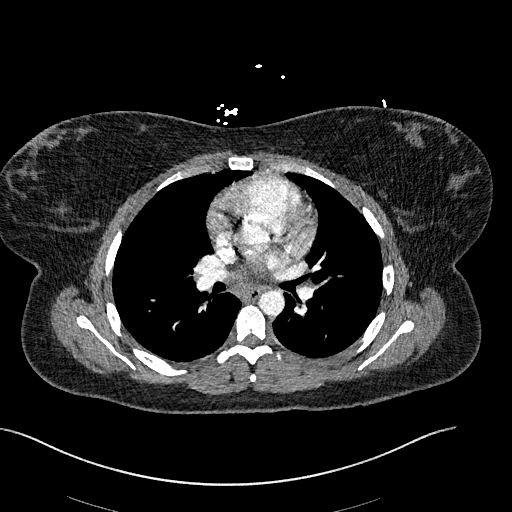
[im 138/245  lung]
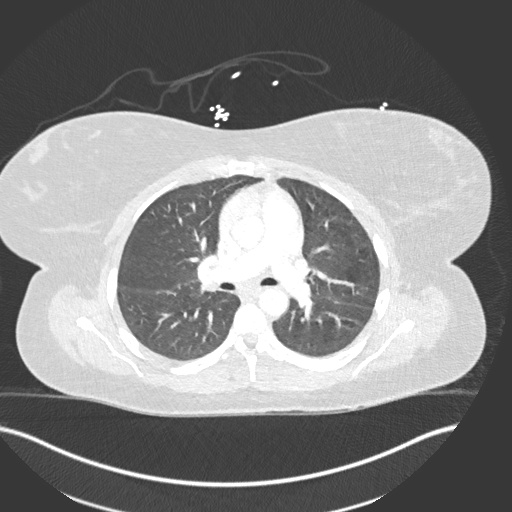
[im 149/245  soft-tissue]
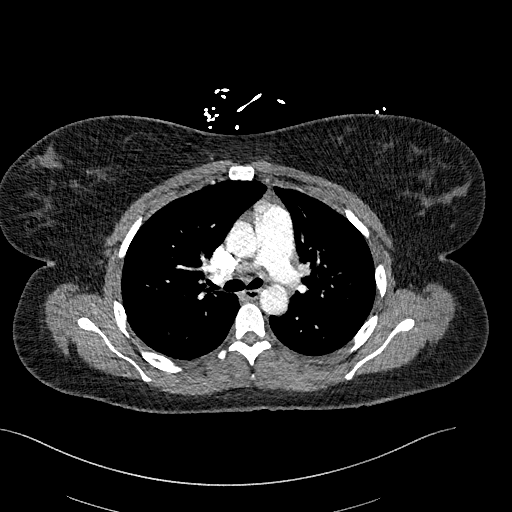
[im 170/245  lung]
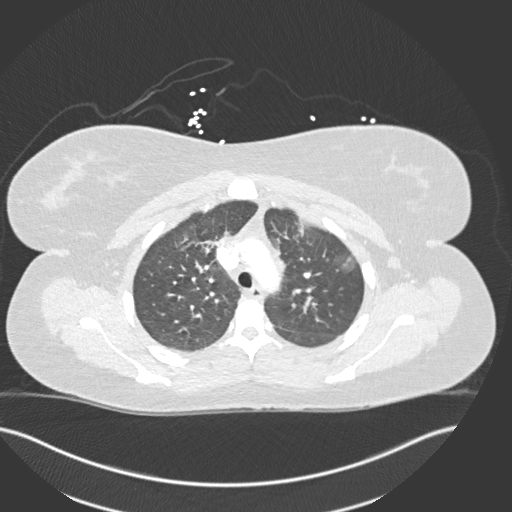
[im 181/245  soft-tissue]
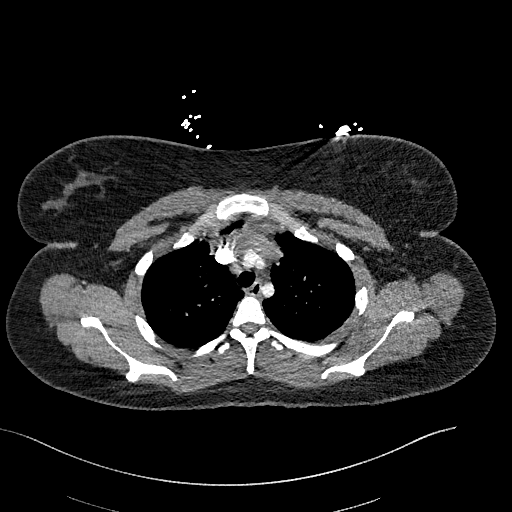
[im 202/245  lung]
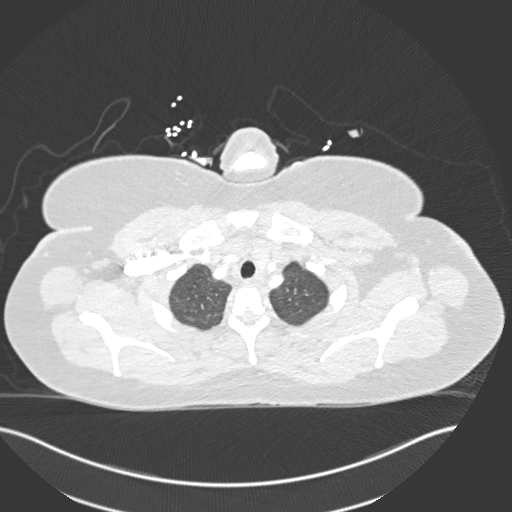
[im 213/245  soft-tissue]
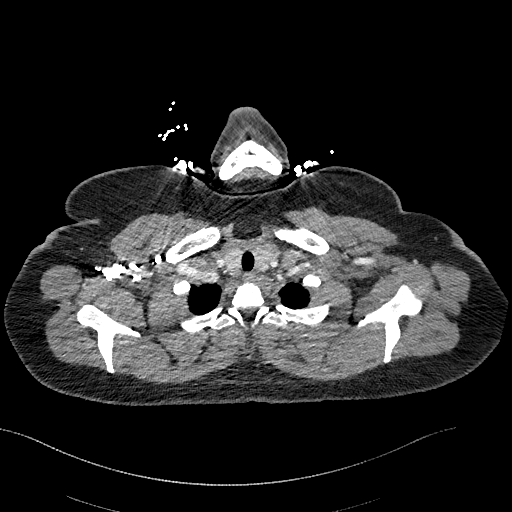
[im 234/245  lung]
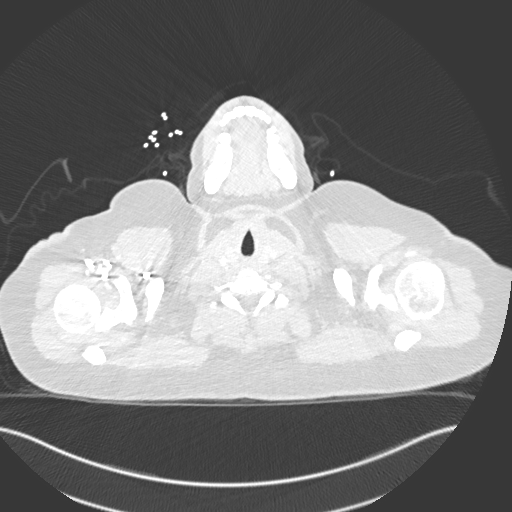

[Series 6: coronal mpr · coronal · 0.48mm/px · 2 of 94 slices shown]
[im 32/94  soft-tissue]
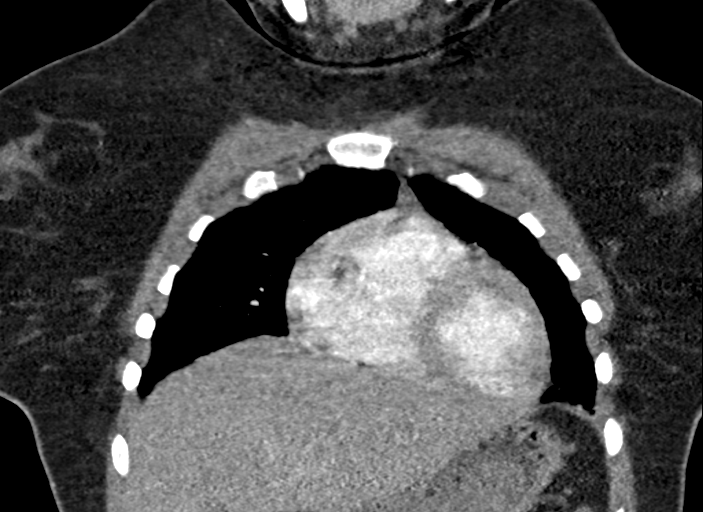
[im 63/94  soft-tissue]
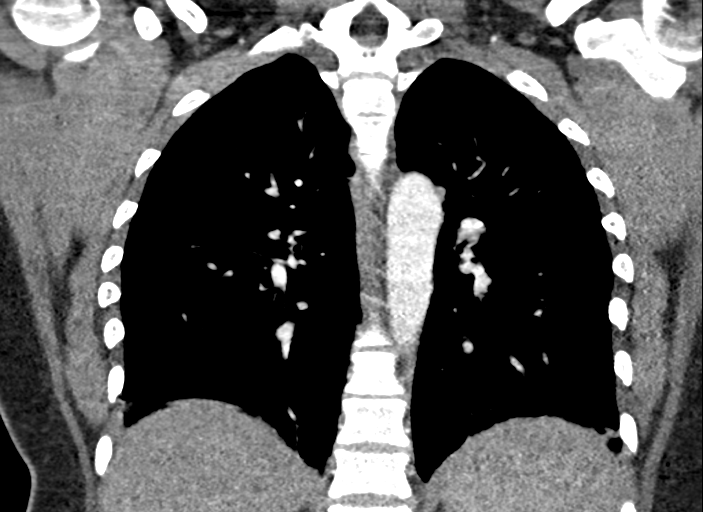

[17 of 46 positions shown; findings below may reference images not displayed]

FINDINGS: CTA CHEST FINDINGS

Cardiovascular: Normal appearance of the heart. Normal thoracic
aorta. Adequate contrast opacification of the pulmonary arteries. No
evidence of pulmonary embolus to the level of the lobar arteries.
Limited evaluation of the segmental and subsegmental pulmonary
arteries due to streak artifact.

Mediastinum/Nodes: No enlarged mediastinal, hilar, or axillary lymph
nodes. Thyroid gland, trachea, and esophagus demonstrate no
significant findings.

Lungs/Pleura: Central airways are patent. Mild ground-glass opacity
of the left upper lobe seen on series 4 image 77, likely infectious
or inflammatory. Bibasilar atelectasis.

Musculoskeletal: No chest wall abnormality. No acute or significant
osseous findings.

CT ABDOMEN and PELVIS FINDINGS

Hepatobiliary: No focal liver abnormality is seen. No gallstones,
gallbladder wall thickening, or biliary dilatation.

Pancreas: Unremarkable. No pancreatic ductal dilatation or
surrounding inflammatory changes.

Spleen: Normal in size without focal abnormality.

Adrenals/Urinary Tract: Adrenal glands are unremarkable. Kidneys are
normal, without renal calculi, focal lesion, or hydronephrosis. Mild
bladder wall thickening.

Stomach/Bowel: Stomach is within normal limits. Appendix is not
visualized, although there are no secondary findings of acute
appendicitis. No evidence of bowel wall thickening, distention, or
inflammatory changes.

Vascular/Lymphatic: No significant vascular findings are present. No
enlarged abdominal or pelvic lymph nodes.

Reproductive: Normal appearing uterus. Right adnexal cyst measuring
up to 4.3 cm, likely physiologic.

Other: Trace free fluid in the pelvis, likely physiologic. No free
intraperitoneal air.

Musculoskeletal: No acute or significant osseous findings.
IMPRESSION: No evidence of pulmonary embolus to the level of the lobar arteries.
Limited evaluation of the segmental and subsegmental pulmonary
arteries due to streak artifact.

Mild ground-glass opacity of the left upper lobe, likely infectious
or inflammatory.

Bladder wall thickening, findings can be seen in the setting of
cystitis. Correlate with urinalysis.
